# Patient Record
Sex: Male | Born: 1970 | Race: Black or African American | Hispanic: No | Marital: Single | Smoking: Current some day smoker
Health system: Southern US, Community
[De-identification: ages and names within clinical notes are randomized; demographics above are authoritative.]

## PROBLEM LIST (undated history)

## (undated) DIAGNOSIS — I10 Essential (primary) hypertension: Secondary | ICD-10-CM

## (undated) DIAGNOSIS — E119 Type 2 diabetes mellitus without complications: Secondary | ICD-10-CM

---

## 1999-11-27 ENCOUNTER — Encounter: Payer: Self-pay | Admitting: Emergency Medicine

## 1999-11-27 ENCOUNTER — Emergency Department (HOSPITAL_COMMUNITY): Admission: EM | Admit: 1999-11-27 | Discharge: 1999-11-27 | Payer: Self-pay | Admitting: Emergency Medicine

## 2000-07-01 ENCOUNTER — Emergency Department (HOSPITAL_COMMUNITY): Admission: EM | Admit: 2000-07-01 | Discharge: 2000-07-01 | Payer: Self-pay

## 2000-08-12 ENCOUNTER — Emergency Department (HOSPITAL_COMMUNITY): Admission: EM | Admit: 2000-08-12 | Discharge: 2000-08-12 | Payer: Self-pay | Admitting: Emergency Medicine

## 2003-10-29 ENCOUNTER — Emergency Department (HOSPITAL_COMMUNITY): Admission: AD | Admit: 2003-10-29 | Discharge: 2003-10-29 | Payer: Self-pay | Admitting: Family Medicine

## 2003-11-10 ENCOUNTER — Emergency Department (HOSPITAL_COMMUNITY): Admission: EM | Admit: 2003-11-10 | Discharge: 2003-11-10 | Payer: Self-pay | Admitting: Family Medicine

## 2006-01-25 ENCOUNTER — Emergency Department (HOSPITAL_COMMUNITY): Admission: EM | Admit: 2006-01-25 | Discharge: 2006-01-25 | Payer: Self-pay | Admitting: Emergency Medicine

## 2015-09-19 ENCOUNTER — Encounter (HOSPITAL_COMMUNITY): Payer: Self-pay | Admitting: Emergency Medicine

## 2015-09-19 ENCOUNTER — Emergency Department (HOSPITAL_COMMUNITY)
Admission: EM | Admit: 2015-09-19 | Discharge: 2015-09-19 | Disposition: A | Discharge status: Home / Self Care | Payer: BLUE CROSS/BLUE SHIELD | Attending: Emergency Medicine | Admitting: Emergency Medicine

## 2015-09-19 DIAGNOSIS — F17200 Nicotine dependence, unspecified, uncomplicated: Secondary | ICD-10-CM | POA: Insufficient documentation

## 2015-09-19 DIAGNOSIS — M5432 Sciatica, left side: Secondary | ICD-10-CM | POA: Insufficient documentation

## 2015-09-19 DIAGNOSIS — M79604 Pain in right leg: Secondary | ICD-10-CM | POA: Diagnosis not present

## 2015-09-19 DIAGNOSIS — M79652 Pain in left thigh: Secondary | ICD-10-CM | POA: Diagnosis present

## 2015-09-19 DIAGNOSIS — M5430 Sciatica, unspecified side: Secondary | ICD-10-CM

## 2015-09-19 MED ORDER — HYDROCODONE-ACETAMINOPHEN 5-325 MG PO TABS: 2.0000 | ORAL_TABLET | ORAL | Status: AC | PRN

## 2015-09-19 MED ORDER — OXYCODONE-ACETAMINOPHEN 5-325 MG PO TABS
1.0000 | ORAL_TABLET | Freq: Once | ORAL | Status: AC
  Administered 2015-09-19: 1 via ORAL
  Filled 2015-09-19: qty 1

## 2015-09-19 NOTE — Progress Notes (Addendum)
Pt given written resources for  Please use the list of bcbs providers offered to you in the ED to assist with finding a dr to follow up Schedule an appointment as soon as possible for a visit Please go to [email or web address], locate find a doctor area to use to find in network primary care provider and specialists  Please verify any provider recommended to you is in network Pt given a 5 page list of internal medicine and orthopedics in network with bcbs    [facility] Bill payment contact number is [phone] - [email or web address]  **COST EFFICIENT PLACES TO GET MEDICATIONS--[employer], [facility], &Target have $4 generic medication programs- **[employer] offers Free 30 day generic antibiotics & diabetic medications --[facility] -Has Self pay applications also   **www.needymeds.org for drug patient assistance programs--Helpline calls dial [phone] or [phone] [facility], [employer] Patient Assistance Program  [phone] [place], [place] [phone]   www.GoodRx.com - Use this site for any prescription given to you by any medical provider.  Provides a list of discount pharmacies, discount coupons to best cost for meds [facility] - [address], [address] [place], [place] [address], Monday to Friday: 8:00 am - 4:30 pm- provides free prescription medication residents in all 100 counties of [place] who are low-income, uninsured [phone] main line [phone] toll free- Obtain a pcp first Uniscriptrx.com use to save up to 75% discount on medicines at pharmacies  [facility] Location: [place], [place]- [id number] Contact Phone: [phone] Services: Dental Care Services, Enabling Services, Obstetrical and Gynecological Care, Primary Medical Care Remarks: Year round  [facility] [address] Location: [place], [place] -- [id number] Contact Phone: [phone] Services: Child, Teen, Adult and Insurance claims handlerenior Healthcare, [employer]'s [facility], Wellness,  Prenatal Care and [employer]Family Planning, Physical Exams (school, employment, sports, nursing home, etc.), Chronic Illness, Minor Trauma, Immunizations, Flu Shots, Stage managerLaboratory Services On-Site, [place] Remarks: Community Health, Urban Area, [facility], Year-Round, Full-Time (open 52 hours per week)  [facility] Location: [place], [place]- [id number] Contact Phone: [phone] Services: Medical Services Remarks: Hours Of Operation: Tuesday: 4:15 PM to 8:00 PM, Thursday: 4:15 PM to 8:00 PM; Eye Clinic Hours: Wednesday 9:00AM - 12:00PM, Thursday 1:00PM - 5:00PM - See more at: [email or web address]

## 2015-09-19 NOTE — ED Provider Notes (Signed)
CSN: [id number]     Arrival date & time 09/19/15  1235 History  By signing my name below, I, Freida Busman, attest that this documentation has been prepared under the direction and in the presence of non-physician practitioner, Melburn Hake, PA-C. Electronically Signed: Freida Busman, Scribe. 09/19/2015. 2:03 PM.    Chief Complaint  Patient presents with  . Leg Pain    The history is provided by the patient. No language interpreter was used.     HPI Comments:  Bobby Alvarez is a 44 y.o. male who presents to the Emergency Department complaining of 10/10, aching pain to his posterior left thigh for ~4 days with radiation down his LLE. He reports increased pain with movement of the leg and with positional change. He denies recent fall, fever, back pain, numbness/tingling/weakness to his BLE and groin, bowel/bladder incontinence and urinary symptoms. He has taken 1600 mg ibuprofen without relief. He has also taken a muscle relaxer, prescribed by an Urgent care he was recently evaluated at for this pain,  with minimal relief. 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   No PCP    History reviewed. No pertinent past medical history. History reviewed. No pertinent past surgical history. History reviewed. No pertinent family history. Social History  Substance Use Topics  . Smoking status: Current Some Day Smoker  . Smokeless tobacco: None  . Alcohol Use: No    Review of Systems  Constitutional: Negative for fever and chills.  Respiratory: Negative for shortness of breath.   Cardiovascular: Negative for chest pain.  Genitourinary: Negative for dysuria and hematuria.  Musculoskeletal: Positive for myalgias. Negative for back pain.  Neurological: Negative for weakness and numbness.     Allergies  Review of patient's allergies indicates no known allergies.  Home Medications   Prior to Admission medications   Medication Sig Start Date End Date Taking?  Authorizing Provider  HYDROcodone-acetaminophen (NORCO/VICODIN) 5-325 MG tablet Take 2 tablets by mouth every 4 (four) hours as needed. 09/19/15   Satira Sark Nadeau, PA-C   BP 165/81 mmHg  Pulse 95  Temp(Src) 98.1 F (36.7 C) (Oral)  Resp 16  SpO2 100% Physical Exam  Constitutional: He is oriented to person, place, and time. He appears well-developed and well-nourished. No distress.  HENT:  Head: Normocephalic and atraumatic.  Eyes: Conjunctivae are normal.  Cardiovascular: Normal rate, regular rhythm, normal heart sounds and intact distal pulses.   Pulses:      Posterior tibial pulses are 2+ on the right side, and 2+ on the left side.  Pulmonary/Chest: Effort normal.  Abdominal: Soft. Bowel sounds are normal. He exhibits no distension. There is no tenderness.  Musculoskeletal:  No mildine CTL tenderneess  Positive right straight leg raise Decrease ROM of back due to severe pain reported by pt TTP at left posterior thigh  Decreased ROM of left hip and knee due to reported pain 2+ PT pulses Sensation intact  Decreased strength of right led due to pain  Pt able to stand and ambulate with mild assistance due to severe reported pain  Neurological: He is alert and oriented to person, place, and time. He has normal reflexes.  Skin: Skin is warm and dry.  Psychiatric: He has a normal mood and affect.  Nursing note and vitals reviewed.   ED Course  Procedures   DIAGNOSTIC STUDIES:  Oxygen Saturation is 100% on RA, normal by my interpretation.    COORDINATION OF CARE:  1:29  PM Will administer pain med in ED and discharge with pain meds. Advised pt to apply ice to the site. Discussed treatment plan with pt at bedside and pt agreed to plan.   MDM   Final diagnoses:  Sciatic leg pain    Patient presents with left posterior thigh pain. Denies any recent fall, trauma, injury. No relief with ibuprofen at home. VSS. Exam revealed decreased range of motion and strength of  back and left leg due to pain. Bilateral lower extremities neurovascularly intact. Positive right straight leg raise. No back pain red flags. I suspect patient's pain is likely due to sciatica, I do not feel that any imaging is warranted at this time. Plan to discharge patient home with pain meds. Advised patient to use NSAIDs and ice/rest at home for pain relief, discussed with patient proper dosage of NSAIDs. Patient given orthopedic f/u.  Evaluation does not show pathology requring ongoing emergent intervention or admission. Pt is hemodynamically stable and mentating appropriately. All questions answered. Return precautions discussed and outpatient follow up given.    I personally performed the services described in this documentation, which was scribed in my presence. The recorded information has been reviewed and is accurate.    Satira Sarkicole Elizabeth Mount CarmelNadeau, [place] JerseyPA-C 09/19/15 1603  Margarita Grizzleanielle Ray, MD 09/20/15 [phone]

## 2015-09-19 NOTE — ED Notes (Signed)
Pt reports burning pain in the left posterior thigh radiating down the left leg upon standing. Says the pain is excruciating. 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 No other c/c.

## 2015-09-19 NOTE — Discharge Instructions (Signed)
Take your medications as prescribed as needed for pain relief. You may also take 800 mg ibuprofen up to 3 times daily. I also recommend applying ice to affected area for 15-20 minutes 3-4 times daily. I also recommend limiting physical activity and stretching for the next few days until your pain/inflammation has improved. Follow-up with your primary care provider this week. I have also listed the contact information for orthopedic follow-up as needed. Return to the emergency department if symptoms worsen or new onset of fever, numbness, tingling, groin anesthesia, loss control of bowel or bladder, weakness.

## 2015-09-21 ENCOUNTER — Other Ambulatory Visit: Payer: Self-pay | Admitting: Occupational Medicine

## 2015-09-21 ENCOUNTER — Ambulatory Visit: Payer: Self-pay

## 2015-09-21 DIAGNOSIS — M545 Low back pain: Secondary | ICD-10-CM

## 2015-11-15 ENCOUNTER — Emergency Department (HOSPITAL_COMMUNITY): Payer: BLUE CROSS/BLUE SHIELD

## 2015-11-15 ENCOUNTER — Encounter (HOSPITAL_COMMUNITY): Payer: Self-pay | Admitting: Emergency Medicine

## 2015-11-15 ENCOUNTER — Emergency Department (HOSPITAL_COMMUNITY)
Admission: EM | Admit: 2015-11-15 | Discharge: 2015-11-15 | Disposition: A | Discharge status: Home / Self Care | Payer: BLUE CROSS/BLUE SHIELD | Attending: Emergency Medicine | Admitting: Emergency Medicine

## 2015-11-15 DIAGNOSIS — I10 Essential (primary) hypertension: Secondary | ICD-10-CM | POA: Diagnosis not present

## 2015-11-15 DIAGNOSIS — F17200 Nicotine dependence, unspecified, uncomplicated: Secondary | ICD-10-CM | POA: Diagnosis not present

## 2015-11-15 DIAGNOSIS — R52 Pain, unspecified: Secondary | ICD-10-CM

## 2015-11-15 DIAGNOSIS — M5440 Lumbago with sciatica, unspecified side: Secondary | ICD-10-CM

## 2015-11-15 DIAGNOSIS — M545 Low back pain: Secondary | ICD-10-CM | POA: Diagnosis present

## 2015-11-15 LAB — CBC WITH DIFFERENTIAL/PLATELET
BASOS ABS: 0 10*3/uL (ref 0.0–0.1)
Basophils Relative: 0 %
EOS PCT: 0 %
Eosinophils Absolute: 0 10*3/uL (ref 0.0–0.7)
HEMATOCRIT: 42.6 % (ref 39.0–52.0)
Hemoglobin: 14.3 g/dL (ref 13.0–17.0)
LYMPHS PCT: 24 %
Lymphs Abs: 1.6 10*3/uL (ref 0.7–4.0)
MCH: 30 pg (ref 26.0–34.0)
MCHC: 33.6 g/dL (ref 30.0–36.0)
MCV: 89.3 fL (ref 78.0–100.0)
MONO ABS: 0.3 10*3/uL (ref 0.1–1.0)
MONOS PCT: 5 %
NEUTROS ABS: 4.9 10*3/uL (ref 1.7–7.7)
Neutrophils Relative %: 71 %
PLATELETS: 233 10*3/uL (ref 150–400)
RBC: 4.77 MIL/uL (ref 4.22–5.81)
RDW: 12.9 % (ref 11.5–15.5)
WBC: 6.9 10*3/uL (ref 4.0–10.5)

## 2015-11-15 LAB — BASIC METABOLIC PANEL
Anion gap: 10 (ref 5–15)
BUN: 15 mg/dL (ref 6–20)
CALCIUM: 8.8 mg/dL — AB (ref 8.9–10.3)
CO2: 23 mmol/L (ref 22–32)
Chloride: 104 mmol/L (ref 101–111)
Creatinine, Ser: 0.93 mg/dL (ref 0.61–1.24)
GFR calc Af Amer: >60 mL/min (ref >60)
GLUCOSE: 157 mg/dL — AB (ref 65–99)
Potassium: 4.1 mmol/L (ref 3.5–5.1)
Sodium: 137 mmol/L (ref 135–145)

## 2015-11-15 MED ORDER — ONDANSETRON 8 MG PO TBDP
8.0000 mg | ORAL_TABLET | Freq: Once | ORAL | Status: AC
  Administered 2015-11-15: 8 mg via ORAL
  Filled 2015-11-15: qty 1

## 2015-11-15 MED ORDER — HYDROMORPHONE HCL 1 MG/ML IJ SOLN
1.0000 mg | Freq: Once | INTRAMUSCULAR | Status: AC
Start: 2015-11-15 — End: 2015-11-15
  Administered 2015-11-15: 1 mg via INTRAMUSCULAR
  Filled 2015-11-15: qty 1

## 2015-11-15 MED ORDER — HYDROMORPHONE HCL 2 MG/ML IJ SOLN
2.0000 mg | Freq: Once | INTRAMUSCULAR | Status: AC
  Administered 2015-11-15: 2 mg via INTRAMUSCULAR
  Filled 2015-11-15: qty 1

## 2015-11-15 MED ORDER — OXYCODONE HCL 5 MG PO TABS: 5.0000 mg | ORAL_TABLET | ORAL | Status: AC | PRN

## 2015-11-15 MED ORDER — DIAZEPAM 5 MG/ML IJ SOLN
10.0000 mg | Freq: Once | INTRAMUSCULAR | Status: AC
  Administered 2015-11-15: 10 mg via INTRAMUSCULAR
  Filled 2015-11-15: qty 2

## 2015-11-15 NOTE — ED Notes (Addendum)
Pt injured back at work 2 weeks ago. Was seen by MD for this and was diagnosed with L leg sciatica. Pt has been on home robaxin and ibuprofen. Pt was walking today when same pain started again. Pt took dose of robaxin prior to EMS arrival and EMS gave intranasal fentanyl. Denies any injuries today.

## 2015-11-15 NOTE — ED Notes (Signed)
Patient transported to MRI 

## 2015-11-15 NOTE — Discharge Instructions (Signed)
Take 4 over the counter ibuprofen tablets 3 times a day or 2 over-the-counter naproxen tablets twice a day for pain.   Chronic Back Pain  When back pain lasts longer than 3 months, it is called chronic back pain.People with chronic back pain often go through certain periods that are more intense (flare-ups).  CAUSES Chronic back pain can be caused by wear and tear (degeneration) on different structures in your back. These structures include:  The bones of your spine (vertebrae) and the joints surrounding your spinal cord and nerve roots (facets).  The strong, fibrous tissues that connect your vertebrae (ligaments). Degeneration of these structures may result in pressure on your nerves. This can lead to constant pain. HOME CARE INSTRUCTIONS  Avoid bending, heavy lifting, prolonged sitting, and activities which make the problem worse.  Take brief periods of rest throughout the day to reduce your pain. Lying down or standing usually is better than sitting while you are resting.  Take over-the-counter or prescription medicines only as directed by your caregiver. SEEK IMMEDIATE MEDICAL CARE IF:   You have weakness or numbness in one of your legs or feet.  You have trouble controlling your bladder or bowels.  You have nausea, vomiting, abdominal pain, shortness of breath, or fainting.   This information is not intended to replace advice given to you by your health care provider. Make sure you discuss any questions you have with your health care provider.   Document Released: 11/08/2004 Document Revised: 12/24/2011 Document Reviewed: 03/21/2015 Elsevier Interactive Patient Education [employer]

## 2015-11-15 NOTE — ED Provider Notes (Signed)
CSN: [id number]     Arrival date & time 11/15/15  4098 History   First MD Initiated Contact with Patient 11/15/15 [phone]     Chief Complaint  Patient presents with  . Back Pain     (Consider location/radiation/quality/duration/timing/severity/associated sxs/prior Treatment) HPI   Bobby Alvarez is a 45 y.o. male who is here for evaluation of intermittent left low back pain which radiates to the entire left leg, and started about one month ago after an injury, "picking up the client". He has been evaluated in the emergency department as well as had a "Workmen's Compensation clinic". Yesterday he was able to work his regular job, but noticed a slight twinge in his lower back and leg. Today he went to work, and was unable to proceed with the job activities because of severe left lower back and leg pain. He was transferred by EMS, and was treated with fentanyl intranasally, without relief of the pain. He denies loss of bowel or bladder function. He denies other traumas. He has various medications, which he takes including anti-inflammatories and narcotics, which have not abated the discomfort. There are no other known modifying factors.  History reviewed. No pertinent past medical history. History reviewed. No pertinent past surgical history. History reviewed. No pertinent family history. Social History  Substance Use Topics  . Smoking status: Current Some Day Smoker  . Smokeless tobacco: None  . Alcohol Use: No    Review of Systems  All other systems reviewed and are negative.     Allergies  Review of patient's allergies indicates no known allergies.  Home Medications   Prior to Admission medications   Medication Sig Start Date End Date Taking? Authorizing Provider  HYDROcodone-acetaminophen (NORCO/VICODIN) 5-325 MG tablet Take 2 tablets by mouth every 4 (four) hours as needed. 09/19/15   Satira Sark Nadeau, PA-C   BP 183/105 mmHg  Pulse 94  Temp(Src) 98 F (36.7 C) (Oral)   SpO2 98% Physical Exam  Constitutional: He is oriented to person, place, and time. He appears well-developed. He appears distressed (He is very uncomfortable, crying, and holding tightly to the stretcher railing.).  Morbidly obese  HENT:  Head: Normocephalic and atraumatic.  Right Ear: External ear normal.  Left Ear: External ear normal.  Eyes: Conjunctivae and EOM are normal. Pupils are equal, round, and reactive to light.  Neck: Normal range of motion and phonation normal. Neck supple.  Cardiovascular: Normal rate.   He has hypertensive  Pulmonary/Chest: Effort normal. He exhibits no bony tenderness.  Abdominal: Soft. There is no tenderness.  Musculoskeletal: Normal range of motion.  Marked left lower back tenderness to palpation, while supine, because he is unable to move, to roll over because of severe pain. Reproduced severe pain with any movement, passively or actively of the left leg.  Neurological: He is alert and oriented to person, place, and time. No cranial nerve deficit or sensory deficit. He exhibits normal muscle tone. Coordination normal.  Plantar flexion left foot, is normal. Further strength testing of the left leg is not possible because of severe pain. Normal strength, arms and right leg.  Skin: Skin is warm, dry and intact.  Psychiatric: He has a normal mood and affect. His behavior is normal. Judgment and thought content normal.  Nursing note and vitals reviewed.   ED Course  Procedures (including critical care time) Medications  HYDROmorphone (DILAUDID) injection 2 mg (not administered)  diazepam (VALIUM) injection 10 mg (not administered)  ondansetron (ZOFRAN-ODT) disintegrating tablet 8 mg (not  administered)    Patient Vitals for the past 24 hrs:  BP Temp Temp src Pulse SpO2  11/15/15 0948 (!) 183/105 mmHg 98 F (36.7 C) Oral 94 98 %    11:30 AM Reevaluation with update and discussion. After initial assessment and treatment, an updated evaluation  reveals his pain is unrelenting. MR ordered to evaluate for acute lumbar disorder. Rohaan Durnil L   Patient's weight/girth are too large for MRI, CT ordered  Labs Review Labs Reviewed - No data to display  Imaging Review Ct Lumbar Spine Wo Contrast  11/15/2015  CLINICAL DATA:  Injured back at work 2 weeks ago. Persistent back and left leg pain. Morbid obesity. EXAM: CT LUMBAR SPINE WITHOUT CONTRAST TECHNIQUE: Multidetector CT imaging of the lumbar spine was performed without intravenous contrast administration. Multiplanar CT image reconstructions were also generated. COMPARISON:  Radiographs 09/21/2015. FINDINGS: Normal alignment of the lumbar vertebral bodies. Disc spaces and vertebral bodies are fairly well maintained. The facets are normally aligned. No pars defects. The last full intervertebral disc space is labeled L5-S1. No sacral abnormalities are demonstrated. No paraspinal or retroperitoneal findings. There is a large simple appearing cyst associated with the left kidney. L1-2:  No significant findings. L2-3:  No significant findings. L3-4: Mild annular bulge but no significant spinal or foraminal stenosis. L4-5: Broad-based bulging annulus and posterior osteophytic spurring. There is mild flattening of the ventral thecal sac and probable bilateral lateral recess stenosis. No obvious foraminal stenosis. No significant spinal stenosis. L5-S1: Mild degenerative disc disease and mild facet disease. There is a diffuse bulging annulus and mild osteophytic ridging but no significant spinal or foraminal stenosis. IMPRESSION: 1. 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 There is mild flattening of the thecal sac and mild lateral recess encroachment. 2. No acute bony findings and no definite pars defects. Electronically Signed   By: Rudie Meyer M.D.   On: 11/15/2015 16:57   I have personally reviewed and evaluated these images and lab results as  part of my medical decision-making.   EKG Interpretation None      MDM   Final diagnoses:  Low back pain with sciatica, sciatica laterality unspecified, unspecified back pain laterality    Radicular back pain. Advanced imaging ordered in ED. Doubt cauda equina syndrome  Nursing Notes Reviewed/ Care Coordinated Applicable Imaging Reviewed Interpretation of Laboratory Data incorporated into ED treatment  Disposition as per Dr. Adela Lank, after discussion of CT results.     Mancel Bale, MD 11/15/15 2104

## 2015-11-15 NOTE — ED Notes (Signed)
Bed: WA09 Expected date:  Expected time:  Means of arrival:  Comments: EMS- leg pain/Fetanyl given

## 2015-11-15 NOTE — Progress Notes (Signed)
Provided pt with a list of internal medicine and orthopedic providers in network with his BCBS PPO plan Encouraged pcp and specialist follow and compliance to ED plan of care  Entered in d/c instructions  Please use the lists of pcps and orthopedic providers in network with BCBS Schedule an appointment as soon as possible for a visit As needed These lists of providers were provided to you by the [facility] case managers to assist you with follow up care   [facility] ED CM spoke with pt on how to obtain an in network pcp with insurance coverage via the customer service number or web site  Cm reviewed ED level of care for crisis/emergent services and community pcp level of care to manage continuous or chronic medical concerns.  The pt voiced understanding CM encouraged pt and discussed pt's responsibility to verify with pt's insurance carrier that any recommended medical provider offered by any emergency room or a hospital provider is within the carrier's network. The pt voiced understanding

## 2015-11-15 NOTE — Progress Notes (Signed)
This [facility] ED CM spoke with pt in December 2016 and provided pt with pcp resources plus  Pt confirms with CM today he did not follow up on resources provided Confirms he has lived in "this part of [place] for ten, fifteen years"

## 2015-11-15 NOTE — ED Notes (Signed)
MD at bedside. 

## 2015-11-30 ENCOUNTER — Ambulatory Visit: Payer: Self-pay | Admitting: Internal Medicine

## 2016-02-20 ENCOUNTER — Emergency Department (HOSPITAL_COMMUNITY)
Admission: EM | Admit: 2016-02-20 | Discharge: 2016-02-20 | Disposition: A | Discharge status: Home / Self Care | Payer: BLUE CROSS/BLUE SHIELD | Attending: Emergency Medicine | Admitting: Emergency Medicine

## 2016-02-20 ENCOUNTER — Encounter (HOSPITAL_COMMUNITY): Payer: Self-pay | Admitting: Emergency Medicine

## 2016-02-20 DIAGNOSIS — M545 Low back pain: Secondary | ICD-10-CM | POA: Diagnosis present

## 2016-02-20 DIAGNOSIS — G8929 Other chronic pain: Secondary | ICD-10-CM | POA: Diagnosis not present

## 2016-02-20 DIAGNOSIS — F17200 Nicotine dependence, unspecified, uncomplicated: Secondary | ICD-10-CM | POA: Insufficient documentation

## 2016-02-20 DIAGNOSIS — M5432 Sciatica, left side: Secondary | ICD-10-CM | POA: Diagnosis not present

## 2016-02-20 MED ORDER — NAPROXEN 500 MG PO TABS: 500.0000 mg | ORAL_TABLET | Freq: Two times a day (BID) | ORAL | Status: AC

## 2016-02-20 NOTE — ED Provider Notes (Signed)
CSN: [id number]     Arrival date & time 02/20/16  1120 History  By signing my name below, I, Bobby Alvarez, attest that this documentation has been prepared under the direction and in the presence of Bobby Fowler, PA-C Electronically Signed: Soijett Alvarez, ED Scribe. 02/20/2016. 1:47 PM.   Chief Complaint  Patient presents with  . Back Pain      The history is provided by the patient. No language interpreter was used.    HPI Comments: Bobby Alvarez is a 45 y.o. male with a medical hx of sciatica, DM, who presents to the Emergency Department complaining of lower back pain onset 1 week. Pt notes that he went to his PCP 1 week ago and was given Ketoralac for his symptoms. Pt states that he is in the ED today for persistent pain. Pt denies recent injury or trauma to his back and notes that he is having a flare up of his prior back pain. He reports that the back pain does radiate to his left leg.   He states that he has tried tramadol and gabapentin with no relief for his symptoms. Pt denies having any of his norco or hydrocodone left. Pt denies gait problem, bowel/bladder incontinence, abdominal pain, dysuria, hematuria, fever, chills, and any other symptoms. Denies CA or IV drug use. Pt denies having a glucometer to check his diabetes. Pt reports that he has a PCP and his neurosurgeon Dr. Danielle Dess that he sees for his back pain. Pt states that he goes to therapy as well for his back.  Per pt chart review: Pt was seen in the ED on 09/19/2015 and 11/15/2015 for back pain. Pt had CT lumbar spine and lumbar spine xray imaging completed. Pt was to have a MR lumber spine, but there is no report of the study being completed. Pt was Rx norco for their symptoms. Pt has also been seen within [facility] in Feb, 2017 and March, 2017. Pt had an MR lumbar spine completed at [facility] care on 12/21/15.  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   L5-S1 left subarticular disc extrusion with transiting left S1 nerve root impingement, as well as moderate left neural foraminal narrowing.   History reviewed. No pertinent past medical history. History reviewed. No pertinent past surgical history. History reviewed. No pertinent family history. Social History  Substance Use Topics  . Smoking status: Current Some Day Smoker  . Smokeless tobacco: None  . Alcohol Use: No    Review of Systems  Constitutional: Negative for fever and chills.  Gastrointestinal: Negative for abdominal pain.       No bowel incontinence  Genitourinary: Negative for dysuria and hematuria.       No bladder incontinence  Musculoskeletal: Positive for back pain. Negative for gait problem.  All other systems reviewed and are negative.     Allergies  Review of patient's allergies indicates no known allergies.  Home Medications   Prior to Admission medications   Medication Sig Start Date End Date Taking? Authorizing Provider  diclofenac (VOLTAREN) 75 MG EC tablet Take 75 mg by mouth 2 (two) times daily as needed for mild pain.    Historical Provider, MD  HYDROcodone-acetaminophen (NORCO/VICODIN) 5-325 MG tablet Take 2 tablets by mouth every 4 (four) hours as needed. Patient taking differently: Take 2 tablets by mouth every 4 (four) hours as needed for moderate pain.  09/19/15   Barrett Henle, PA-C  ibuprofen (  ADVIL,MOTRIN) 800 MG tablet Take 800 mg by mouth every 8 (eight) hours as needed for moderate pain.    Historical Provider, MD  methocarbamol (ROBAXIN) 750 MG tablet Take 750 mg by mouth every 6 (six) hours as needed for muscle spasms.    Historical Provider, MD  naproxen (NAPROSYN) 500 MG tablet Take 1 tablet (500 mg total) by mouth 2 (two) times daily. 02/20/16   Bobby FowlerKayla Claribel Sachs, PA-C  oxyCODONE (ROXICODONE) 5 MG immediate release tablet Take 1 tablet (5 mg total) by mouth every 4 (four) hours as needed for severe pain. 11/15/15   Melene Planan Floyd, DO   BP  155/117 mmHg  Pulse 81  Temp(Src) 98.5 F (36.9 C) (Oral)  Resp 15  SpO2 99% Physical Exam  Constitutional: He is oriented to person, place, and time. He appears well-developed and well-nourished.  HENT:  Head: Atraumatic.  Eyes: Conjunctivae are normal.  Cardiovascular: Normal rate, regular rhythm, normal heart sounds and intact distal pulses.   Pulses:      Dorsalis pedis pulses are 2+ on the right side, and 2+ on the left side.  Pulmonary/Chest: Effort normal and breath sounds normal.  Abdominal: Soft. Bowel sounds are normal. He exhibits no distension. There is no tenderness.  Musculoskeletal: He exhibits tenderness.  No spinous process tenderness.  No step offs. No crepitus.  TTP along sciatic notch, reproducible pain.   Neurological: He is alert and oriented to person, place, and time.  No saddle anesthesia. Strength and sensation intact bilaterally throughout lower extremities, (R>L) secondary to pain. +SLR on the left.   Skin: Skin is warm and dry.  Psychiatric: He has a normal mood and affect. His behavior is normal.    ED Course  Procedures (including critical care time) DIAGNOSTIC STUDIES: Oxygen Saturation is 100% on RA, nl by my interpretation.    COORDINATION OF CARE: 1:46 PM Discussed treatment plan with pt at bedside which includes CBG, naprosyn, follow up with neurologist, and pt agreed to plan.    Labs Review Labs Reviewed - No data to display  Imaging Review No results found. I have personally reviewed and evaluated these lab results as part of my medical decision-making.   EKG Interpretation None      MDM   Final diagnoses:  Sciatica of left side  Chronic pain    Patient with back pain.  No neurological deficits and normal neuro exam.  Patient is ambulatory.  No loss of bowel or bladder control.  No concern for cauda equina.  No fever, night sweats, weight loss, h/o cancer, IVDA, no recent procedure to back. No urinary symptoms suggestive of  UTI.  Discussed with patient that I will not be prescribing narcotics.  Will not give Toradol given recent use of Ketoralac.  Patient has diabetes and is not able to monitor his sugars, therefore, I do not think that Decadron is warranted at this time.  Patient advised to call his Neurosurgeon for management of chronic pain.  Supportive care and return precaution discussed. Appears safe for discharge at this time. Follow up as indicated in discharge paperwork.   I personally performed the services described in this documentation, which was scribed in my presence. The recorded information has been reviewed and is accurate.    Bobby FowlerKayla Atlantis Delong, PA-C 02/20/16 1404  Mancel BaleElliott Wentz, MD 02/21/16 1105

## 2016-02-20 NOTE — Discharge Instructions (Signed)

## 2016-02-20 NOTE — ED Notes (Signed)
Patient here with complaints of lower back pain. Hx of sciatica.

## 2017-03-26 IMAGING — CT CT L SPINE W/O CM
3 of 4 series · 9 of 34 positions shown, 11 images · non-contrast
Comparison: Radiographs 09/21/2015.

CLINICAL DATA: Injured back at work 2 weeks ago. Persistent back
and left leg pain. Morbid obesity.

EXAM:
CT LUMBAR SPINE WITHOUT CONTRAST
TECHNIQUE: Multidetector CT imaging of the lumbar spine was performed without
intravenous contrast administration. Multiplanar CT image
reconstructions were also generated.

[Series 3: l-spine · axial · 0.29mm/px · z∈[+1298,+1298]mm · 1 of 163 slices shown, 2 images]
[im 82/163  soft-tissue]
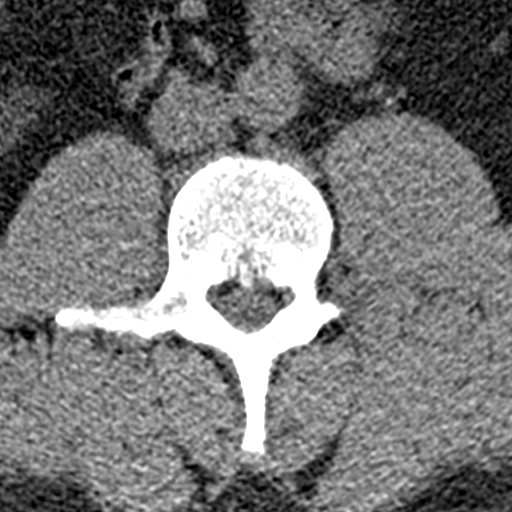
[im 82/163  bone]
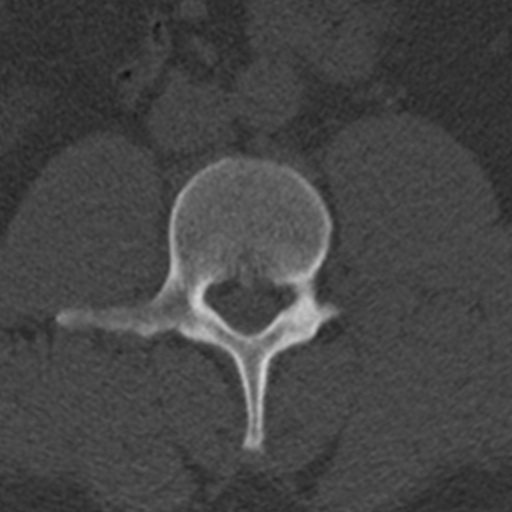

[Series 5: coronal · coronal · 0.32mm/px · 3 of 70 slices shown]
[im 14/70  bone]
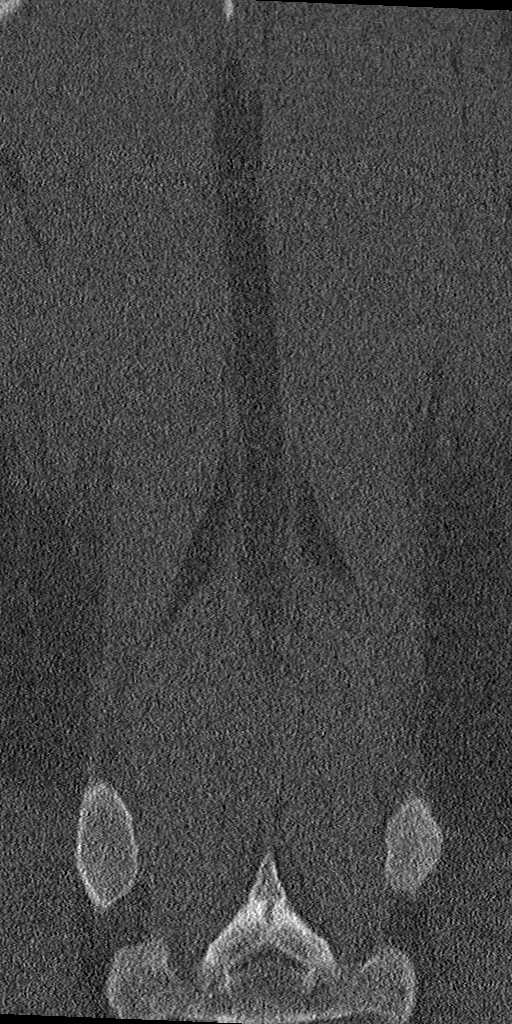
[im 28/70  bone]
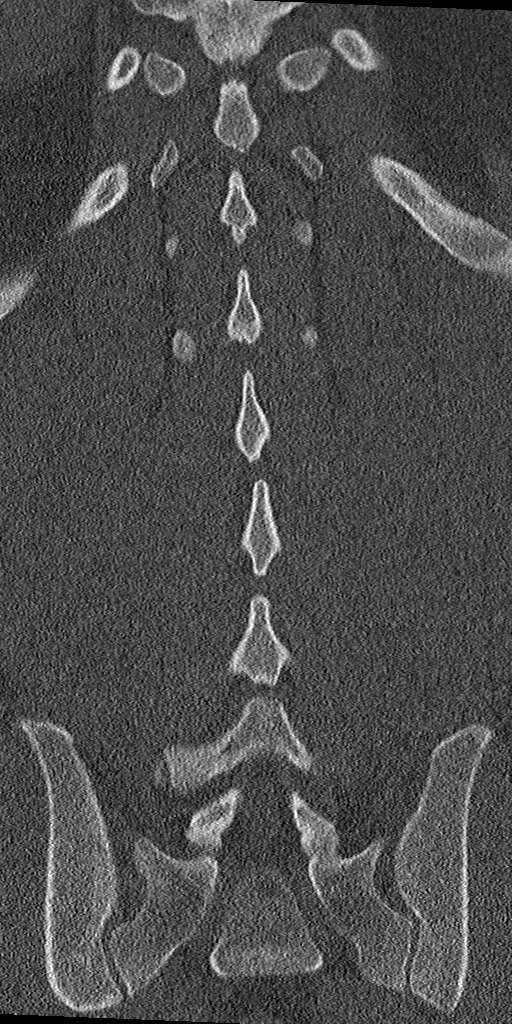
[im 42/70  bone]
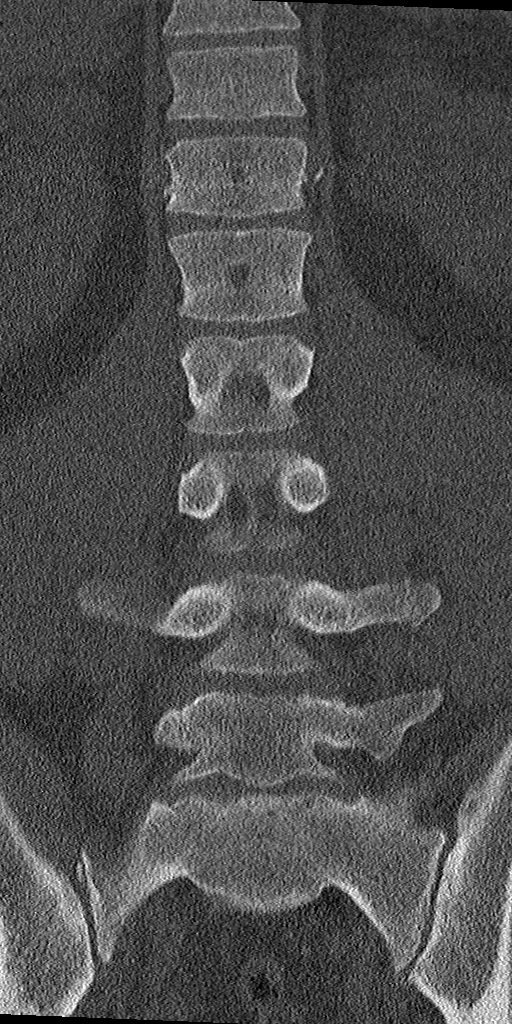

[Series 8: sagittal st · sagittal · 0.32mm/px · 5 of 67 slices shown, 6 images]
[im 23/67  bone]
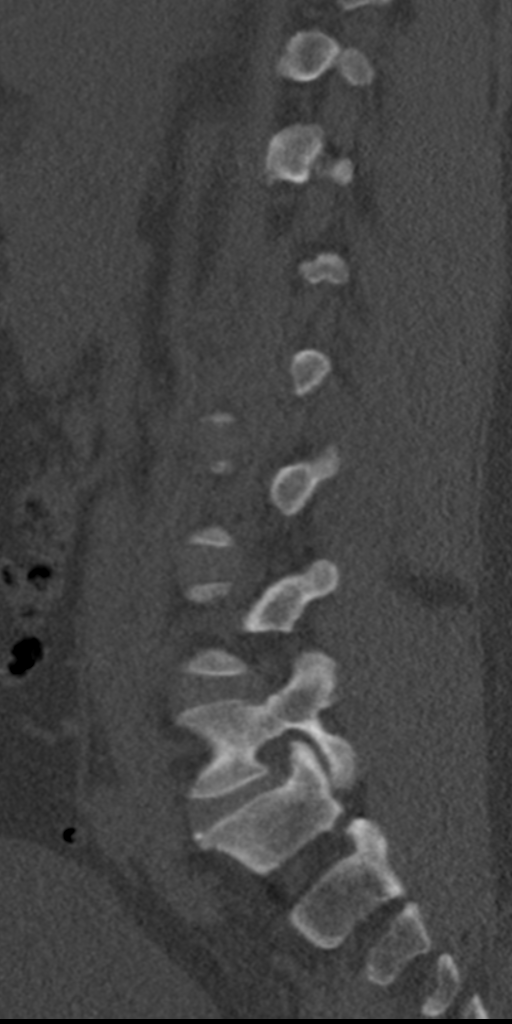
[im 28/67  bone]
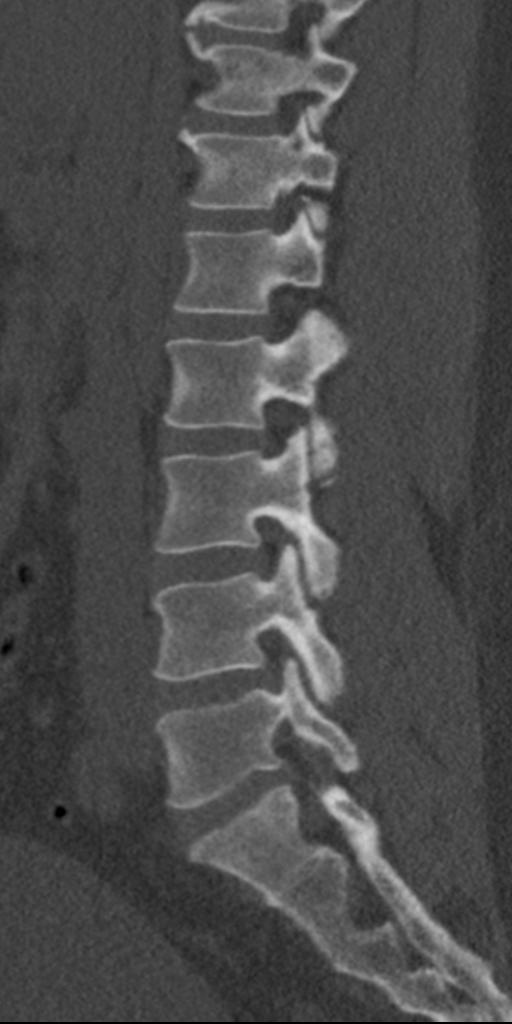
[im 34/67  soft-tissue]
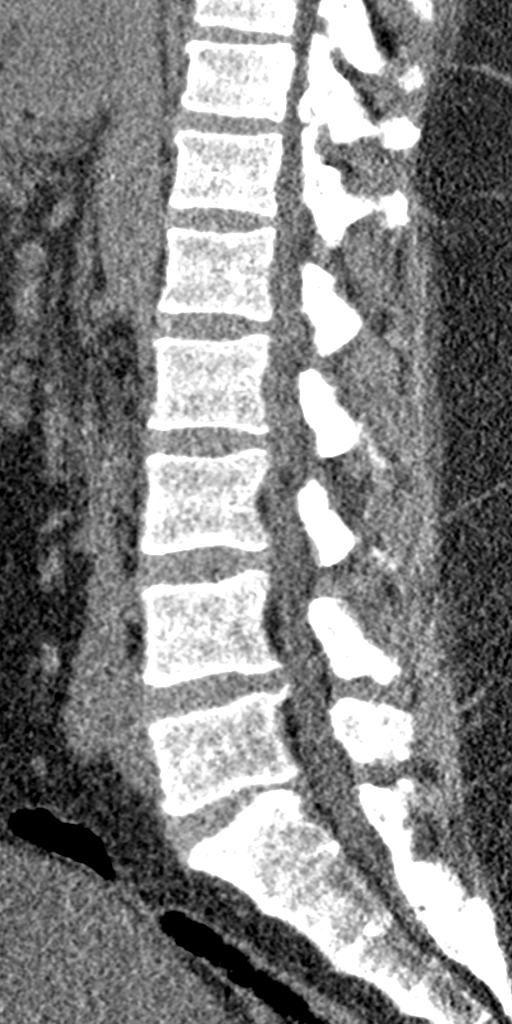
[im 34/67  bone]
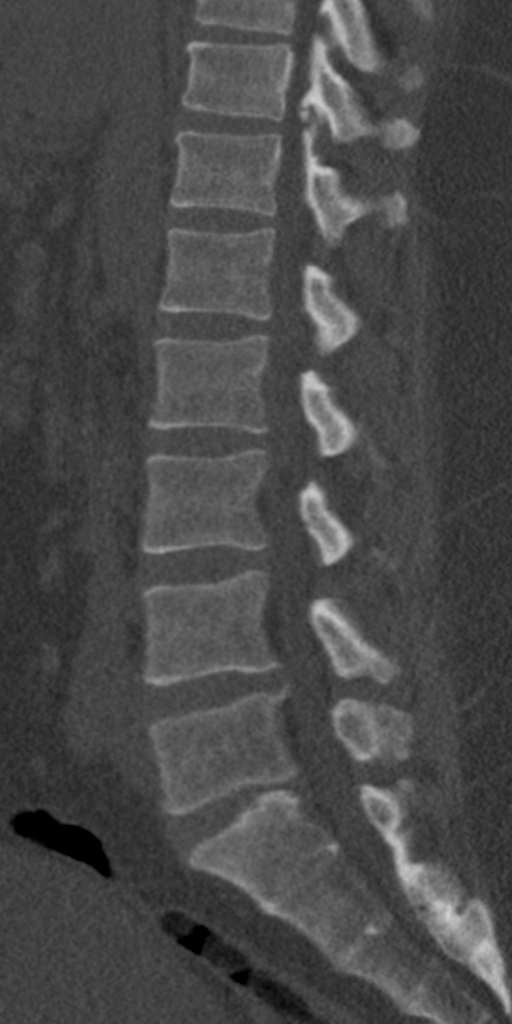
[im 39/67  bone]
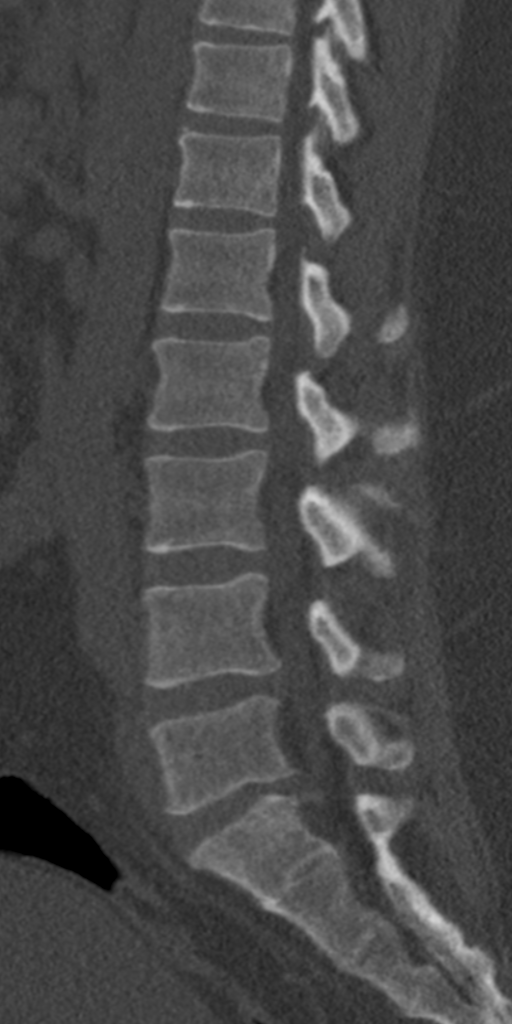
[im 45/67  bone]
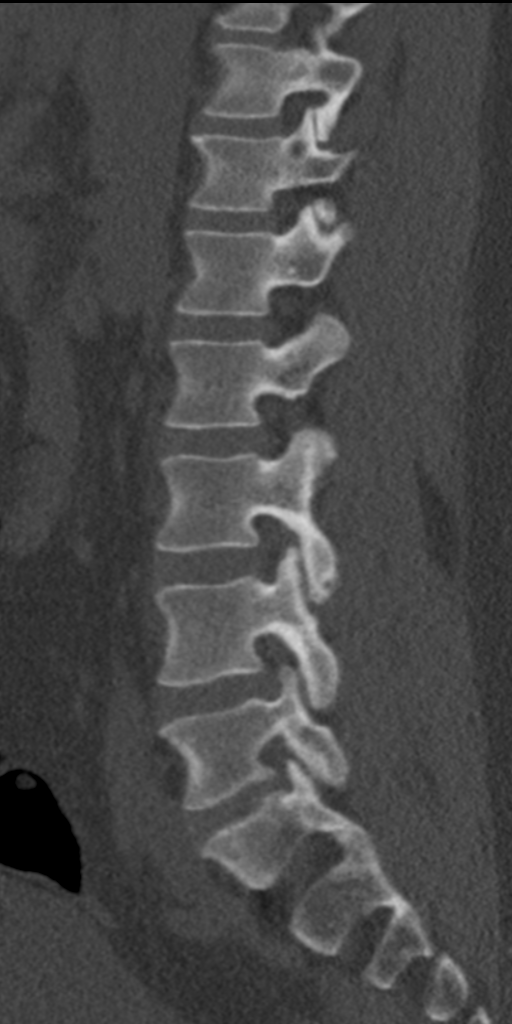

[9 of 34 positions shown; findings below may reference images not displayed]

FINDINGS: Normal alignment of the lumbar vertebral bodies. Disc spaces and
vertebral bodies are fairly well maintained. The facets are normally
aligned. No pars defects. The last full intervertebral disc space is
labeled L5-S1. No sacral abnormalities are demonstrated. No
paraspinal or retroperitoneal findings. There is a large simple
appearing cyst associated with the left kidney.

L1-2:  No significant findings.

L2-3:  No significant findings.

L3-4: Mild annular bulge but no significant spinal or foraminal
stenosis.

L4-5: Broad-based bulging annulus and posterior osteophytic
spurring. There is mild flattening of the ventral thecal sac and
probable bilateral lateral recess stenosis. No obvious foraminal
stenosis. No significant spinal stenosis.

L5-S1: Mild degenerative disc disease and mild facet disease. There
is a diffuse bulging annulus and mild osteophytic ridging but no
significant spinal or foraminal stenosis.
IMPRESSION: 1. Mild disc disease and facet disease at L4-5 and L5-S1 with
osteophytic ridging and bulging discs. There is mild flattening of
the thecal sac and mild lateral recess encroachment.
2. No acute bony findings and no definite pars defects.

## 2019-02-16 DIAGNOSIS — M545 Low back pain: Secondary | ICD-10-CM | POA: Diagnosis not present

## 2019-02-16 DIAGNOSIS — E119 Type 2 diabetes mellitus without complications: Secondary | ICD-10-CM | POA: Diagnosis not present

## 2019-02-16 DIAGNOSIS — E559 Vitamin D deficiency, unspecified: Secondary | ICD-10-CM | POA: Diagnosis not present

## 2019-02-16 DIAGNOSIS — E7800 Pure hypercholesterolemia, unspecified: Secondary | ICD-10-CM | POA: Diagnosis not present

## 2019-02-16 DIAGNOSIS — I10 Essential (primary) hypertension: Secondary | ICD-10-CM | POA: Diagnosis not present

## 2019-02-16 DIAGNOSIS — M5430 Sciatica, unspecified side: Secondary | ICD-10-CM | POA: Diagnosis not present

## 2019-02-16 DIAGNOSIS — R5383 Other fatigue: Secondary | ICD-10-CM | POA: Diagnosis not present

## 2019-03-16 DIAGNOSIS — I517 Cardiomegaly: Secondary | ICD-10-CM | POA: Diagnosis not present

## 2019-03-16 DIAGNOSIS — M545 Low back pain: Secondary | ICD-10-CM | POA: Diagnosis not present

## 2019-03-16 DIAGNOSIS — E559 Vitamin D deficiency, unspecified: Secondary | ICD-10-CM | POA: Diagnosis not present

## 2019-03-16 DIAGNOSIS — M5430 Sciatica, unspecified side: Secondary | ICD-10-CM | POA: Diagnosis not present

## 2019-03-16 DIAGNOSIS — H6123 Impacted cerumen, bilateral: Secondary | ICD-10-CM | POA: Diagnosis not present

## 2019-05-20 DIAGNOSIS — E119 Type 2 diabetes mellitus without complications: Secondary | ICD-10-CM | POA: Diagnosis not present

## 2019-05-20 DIAGNOSIS — E7800 Pure hypercholesterolemia, unspecified: Secondary | ICD-10-CM | POA: Diagnosis not present

## 2019-05-20 DIAGNOSIS — R5383 Other fatigue: Secondary | ICD-10-CM | POA: Diagnosis not present

## 2019-05-20 DIAGNOSIS — M545 Low back pain: Secondary | ICD-10-CM | POA: Diagnosis not present

## 2019-05-20 DIAGNOSIS — M5430 Sciatica, unspecified side: Secondary | ICD-10-CM | POA: Diagnosis not present

## 2019-05-20 DIAGNOSIS — E559 Vitamin D deficiency, unspecified: Secondary | ICD-10-CM | POA: Diagnosis not present

## 2019-05-20 DIAGNOSIS — I517 Cardiomegaly: Secondary | ICD-10-CM | POA: Diagnosis not present

## 2019-11-19 DIAGNOSIS — Z79899 Other long term (current) drug therapy: Secondary | ICD-10-CM | POA: Diagnosis not present

## 2020-01-25 DIAGNOSIS — E559 Vitamin D deficiency, unspecified: Secondary | ICD-10-CM | POA: Diagnosis not present

## 2020-01-25 DIAGNOSIS — I517 Cardiomegaly: Secondary | ICD-10-CM | POA: Diagnosis not present

## 2020-01-25 DIAGNOSIS — J449 Chronic obstructive pulmonary disease, unspecified: Secondary | ICD-10-CM | POA: Diagnosis not present

## 2020-01-25 DIAGNOSIS — M5430 Sciatica, unspecified side: Secondary | ICD-10-CM | POA: Diagnosis not present

## 2020-01-28 DIAGNOSIS — I517 Cardiomegaly: Secondary | ICD-10-CM | POA: Diagnosis not present

## 2020-01-28 DIAGNOSIS — E559 Vitamin D deficiency, unspecified: Secondary | ICD-10-CM | POA: Diagnosis not present

## 2020-01-28 DIAGNOSIS — M5430 Sciatica, unspecified side: Secondary | ICD-10-CM | POA: Diagnosis not present

## 2020-01-28 DIAGNOSIS — M545 Low back pain: Secondary | ICD-10-CM | POA: Diagnosis not present

## 2020-02-08 DIAGNOSIS — I10 Essential (primary) hypertension: Secondary | ICD-10-CM | POA: Diagnosis not present

## 2020-02-08 DIAGNOSIS — M545 Low back pain: Secondary | ICD-10-CM | POA: Diagnosis not present

## 2020-02-08 DIAGNOSIS — E119 Type 2 diabetes mellitus without complications: Secondary | ICD-10-CM | POA: Diagnosis not present

## 2020-02-08 DIAGNOSIS — G4730 Sleep apnea, unspecified: Secondary | ICD-10-CM | POA: Diagnosis not present

## 2020-02-22 DIAGNOSIS — E559 Vitamin D deficiency, unspecified: Secondary | ICD-10-CM | POA: Diagnosis not present

## 2020-02-22 DIAGNOSIS — M5430 Sciatica, unspecified side: Secondary | ICD-10-CM | POA: Diagnosis not present

## 2020-02-22 DIAGNOSIS — E7800 Pure hypercholesterolemia, unspecified: Secondary | ICD-10-CM | POA: Diagnosis not present

## 2020-02-22 DIAGNOSIS — E119 Type 2 diabetes mellitus without complications: Secondary | ICD-10-CM | POA: Diagnosis not present

## 2020-02-22 DIAGNOSIS — M545 Low back pain: Secondary | ICD-10-CM | POA: Diagnosis not present

## 2022-08-09 DIAGNOSIS — E559 Vitamin D deficiency, unspecified: Secondary | ICD-10-CM | POA: Diagnosis not present

## 2022-08-09 DIAGNOSIS — E118 Type 2 diabetes mellitus with unspecified complications: Secondary | ICD-10-CM | POA: Diagnosis not present

## 2022-08-09 DIAGNOSIS — I10 Essential (primary) hypertension: Secondary | ICD-10-CM | POA: Diagnosis not present

## 2022-08-09 DIAGNOSIS — Z1211 Encounter for screening for malignant neoplasm of colon: Secondary | ICD-10-CM | POA: Diagnosis not present

## 2022-08-09 DIAGNOSIS — Z7185 Encounter for immunization safety counseling: Secondary | ICD-10-CM | POA: Diagnosis not present

## 2022-11-30 DIAGNOSIS — E550 Rickets, active: Secondary | ICD-10-CM | POA: Diagnosis not present

## 2022-11-30 DIAGNOSIS — I10 Essential (primary) hypertension: Secondary | ICD-10-CM | POA: Diagnosis not present

## 2022-11-30 DIAGNOSIS — Z6841 Body Mass Index (BMI) 40.0 and over, adult: Secondary | ICD-10-CM | POA: Diagnosis not present

## 2022-11-30 DIAGNOSIS — Z1211 Encounter for screening for malignant neoplasm of colon: Secondary | ICD-10-CM | POA: Diagnosis not present

## 2022-11-30 DIAGNOSIS — E559 Vitamin D deficiency, unspecified: Secondary | ICD-10-CM | POA: Diagnosis not present

## 2022-11-30 DIAGNOSIS — E118 Type 2 diabetes mellitus with unspecified complications: Secondary | ICD-10-CM | POA: Diagnosis not present

## 2022-12-11 DIAGNOSIS — Z1212 Encounter for screening for malignant neoplasm of rectum: Secondary | ICD-10-CM | POA: Diagnosis not present

## 2022-12-11 DIAGNOSIS — Z1211 Encounter for screening for malignant neoplasm of colon: Secondary | ICD-10-CM | POA: Diagnosis not present

## 2023-02-24 ENCOUNTER — Emergency Department (HOSPITAL_COMMUNITY)
Admission: EM | Admit: 2023-02-24 | Discharge: 2023-02-24 | Disposition: A | Discharge status: Home / Self Care | Payer: BC Managed Care – PPO | Attending: Emergency Medicine | Admitting: Emergency Medicine

## 2023-02-24 ENCOUNTER — Encounter (HOSPITAL_COMMUNITY): Payer: Self-pay | Admitting: Pharmacy Technician

## 2023-02-24 ENCOUNTER — Other Ambulatory Visit: Payer: Self-pay

## 2023-02-24 DIAGNOSIS — L723 Sebaceous cyst: Secondary | ICD-10-CM | POA: Diagnosis not present

## 2023-02-24 DIAGNOSIS — E119 Type 2 diabetes mellitus without complications: Secondary | ICD-10-CM | POA: Diagnosis not present

## 2023-02-24 HISTORY — DX: Type 2 diabetes mellitus without complications: E11.9

## 2023-02-24 HISTORY — DX: Essential (primary) hypertension: I10

## 2023-02-24 NOTE — Discharge Instructions (Signed)
You were seen in the emergency department for the cystic structures in your arm in your leg.  You had no signs of blood clot, abscess or infection causing the cysts and they are likely benign skin cysts.  You can follow-up with your primary doctor as needed.  You should return to the emergency department if your arm or leg becomes swollen, the cyst become significantly bigger and painful, you have fevers or if you have any other new or concerning symptoms.

## 2023-02-24 NOTE — ED Provider Notes (Signed)
[place] EMERGENCY DEPARTMENT AT [facility] Provider Note   CSN: [id number] Arrival date & time: 02/24/23  0913     History  Chief Complaint  Patient presents with   Leg Pain    Bobby Alvarez is a 52 y.o. male.  Patient is a 52 year old male with a past medical history of diabetes presenting to the emergency department with complaints of cysts on his left arm and right leg.  He states that he first noticed these yesterday.  He states that they are not painful and have not changed in size.  He denies any fevers.  He denies any swelling of his arms or legs.  Patient states that he did have blood work drawn from his left arm about 2 months ago.  He denies any history of blood clots, recent hospitalization or surgery, recent travel in the car or plane, hormone use or cancer history.  The history is provided by the patient.  Leg Pain      Home Medications Prior to Admission medications   Medication Sig Start Date End Date Taking? Authorizing Provider  diclofenac (VOLTAREN) 75 MG EC tablet Take 75 mg by mouth 2 (two) times daily as needed for mild pain.    [provider]  HYDROcodone-acetaminophen (NORCO/VICODIN) 5-325 MG tablet Take 2 tablets by mouth every 4 (four) hours as needed. Patient taking differently: Take 2 tablets by mouth every 4 (four) hours as needed for moderate pain.  09/19/15   Barrett Henle, PA-C  ibuprofen (ADVIL,MOTRIN) 800 MG tablet Take 800 mg by mouth every 8 (eight) hours as needed for moderate pain.    [provider]  methocarbamol (ROBAXIN) 750 MG tablet Take 750 mg by mouth every 6 (six) hours as needed for muscle spasms.    [provider]  naproxen (NAPROSYN) 500 MG tablet Take 1 tablet (500 mg total) by mouth 2 (two) times daily. 02/20/16   Cheri Fowler, PA-C  oxyCODONE (ROXICODONE) 5 MG immediate release tablet Take 1 tablet (5 mg total) by mouth every 4 (four) hours as needed for severe pain. 11/15/15    Melene Plan, DO      Allergies    Patient has no known allergies.    Review of Systems   Review of Systems  Physical Exam Updated Vital Signs BP (!) 149/88 (BP Location: Right Arm)   Pulse 84   Temp 97.6 F (36.4 C)   Resp 17   SpO2 99%  Physical Exam Vitals and nursing note reviewed.  Constitutional:      General: He is not in acute distress.    Appearance: Normal appearance.  HENT:     Head: Normocephalic.     Nose: Nose normal.     Mouth/Throat:     Mouth: Mucous membranes are moist.  Eyes:     Extraocular Movements: Extraocular movements intact.  Cardiovascular:     Rate and Rhythm: Normal rate.     Pulses: Normal pulses.  Pulmonary:     Effort: Pulmonary effort is normal.  Musculoskeletal:        General: No swelling. Normal range of motion.     Cervical back: Normal range of motion.  Skin:    General: Skin is warm and dry.     Comments: ~0.5 cm diameter cystic structure just distal and slightly lateral to L antecubital fossa, no palpable fluctuance, no surrounding erythema or warmth, non-tender to palpation Two ~0.5 cm diameter cystic structures to medial right calf ~4 cm  distal to knee, no palpable fluctuance, no surrounding erythema or warmth, non-tender to palpation  Neurological:     General: No focal deficit present.     Mental Status: He is alert and oriented to person, place, and time.  Psychiatric:        Mood and Affect: Mood normal.        Behavior: Behavior normal.     ED Results / Procedures / Treatments   Labs (all labs ordered are listed, but only abnormal results are displayed) Labs Reviewed - No data to display  EKG None  Radiology No results found.  Procedures Ultrasound ED Soft Tissue  Date/Time: 02/24/2023 11:08 AM  Performed by: Rexford Maus, DO Authorized by: Rexford Maus, DO   Procedure details:    Indications comment:  Extremity cysts   Transverse view:  Visualized   Longitudinal view:  Visualized    Images: archived   Location:    Location: upper extremity     Location comment:  LUE, RLE Findings:     no abscess present    no cellulitis present    no foreign body present Comments:     Unable to visualize cystic structure, no underlying blood vessels in RLE, superficial vein lateral to cyst in L forearm, compressible     Medications Ordered in ED Medications - No data to display  ED Course/ Medical Decision Making/ A&P                             Medical Decision Making This patient presents to the ED with chief complaint(s) of cystic structures in LUE and RLE with pertinent past medical history of DM which further complicates the presenting complaint. The complaint involves an extensive differential diagnosis and also carries with it a high risk of complications and morbidity.    The differential diagnosis includes patient has cysts, thrombophlebitis, abscess less likely as no erythema or palpable fluctuance, no extremity swelling or pain making DVT unlikely  Additional history obtained: Additional history obtained from N/A Records reviewed N/A  ED Course and Reassessment: Patient has subcutaneous cystic structures palpated on exam of the left forearm and right lower extremity.  Bedside ultrasound was performed and I was unable to visualize the cystic structure.  He did have a nearby vein in the left forearm but no evidence of thrombophlebitis and no signs of abscess or cellulitis on exam.  This is likely sebaceous cysts and he can follow-up with his primary doctor as needed.  Independent labs interpretation:  N/A  Independent visualization of imaging: N/A  Consultation: - Consulted or discussed management/test interpretation w/ external professional: N/A  Consideration for admission or further workup: Patient has no emergent conditions requiring admission or further work-up at this time and is stable for discharge home with primary care follow-up  Social Determinants  of health: N/A            Final Clinical Impression(s) / ED Diagnoses Final diagnoses:  Sebaceous cyst    Rx / DC Orders ED Discharge Orders     None         Rexford Maus, DO 02/24/23 1112

## 2023-02-24 NOTE — ED Triage Notes (Signed)
Pt here with reports of a knot to L forearm and to R calf. First noticed 2 days ago. Some soreness reported to R calf. Denies chest pain or shob.

## 2024-02-03 DIAGNOSIS — E119 Type 2 diabetes mellitus without complications: Secondary | ICD-10-CM | POA: Diagnosis not present

## 2024-02-03 DIAGNOSIS — E785 Hyperlipidemia, unspecified: Secondary | ICD-10-CM | POA: Diagnosis not present

## 2024-02-03 DIAGNOSIS — I10 Essential (primary) hypertension: Secondary | ICD-10-CM | POA: Diagnosis not present

## 2024-05-04 DIAGNOSIS — H938X9 Other specified disorders of ear, unspecified ear: Secondary | ICD-10-CM | POA: Diagnosis not present

## 2024-05-04 DIAGNOSIS — E119 Type 2 diabetes mellitus without complications: Secondary | ICD-10-CM | POA: Diagnosis not present

## 2024-05-04 DIAGNOSIS — I10 Essential (primary) hypertension: Secondary | ICD-10-CM | POA: Diagnosis not present

## 2024-08-03 DIAGNOSIS — I10 Essential (primary) hypertension: Secondary | ICD-10-CM | POA: Diagnosis not present

## 2024-08-03 DIAGNOSIS — Z2821 Immunization not carried out because of patient refusal: Secondary | ICD-10-CM | POA: Diagnosis not present

## 2024-08-03 DIAGNOSIS — E119 Type 2 diabetes mellitus without complications: Secondary | ICD-10-CM | POA: Diagnosis not present
